# Patient Record
Sex: Male | Born: 2000 | Race: White | Hispanic: No | Marital: Single
Health system: Southern US, Community
[De-identification: ages and names within clinical notes are randomized; demographics above are authoritative.]

---

## 2020-07-17 ENCOUNTER — Emergency Department: Payer: 59

## 2020-07-17 DIAGNOSIS — R0789 Other chest pain: Secondary | ICD-10-CM | POA: Insufficient documentation

## 2020-07-17 MED ORDER — OXYCODONE-ACETAMINOPHEN 5-325 MG PO TABS
1.0000 | ORAL_TABLET | ORAL | Status: DC | PRN
  Administered 2020-07-17: 1 via ORAL
  Filled 2020-07-17: qty 1

## 2020-07-17 NOTE — ED Triage Notes (Signed)
Pt to ED reporting left sided rib pain x 3 days that has worsened over the past couple hours. Pt was dx by trainer at [facility] with a subluxed rib. Pain with taking a deep breath but no obvious respiratory distress in triage. Pt appears very uncomfortable at this time.

## 2020-07-18 ENCOUNTER — Emergency Department
Admission: EM | Admit: 2020-07-18 | Discharge: 2020-07-18 | Disposition: A | Discharge status: Home / Self Care | Payer: 59 | Attending: Emergency Medicine | Admitting: Emergency Medicine

## 2020-07-18 DIAGNOSIS — R0789 Other chest pain: Secondary | ICD-10-CM

## 2020-07-18 MED ORDER — KETOROLAC TROMETHAMINE 60 MG/2ML IM SOLN
30.0000 mg | Freq: Once | INTRAMUSCULAR | Status: AC
  Administered 2020-07-18: 30 mg via INTRAMUSCULAR
  Filled 2020-07-18: qty 2

## 2020-07-18 MED ORDER — KETOROLAC TROMETHAMINE 10 MG PO TABS: 10.0000 mg | ORAL_TABLET | Freq: Three times a day (TID) | ORAL | 0 refills | Status: AC | PRN

## 2020-07-18 MED ORDER — LIDOCAINE 5 % EX PTCH
1.0000 | MEDICATED_PATCH | CUTANEOUS | Status: DC
  Administered 2020-07-18: 1 via TRANSDERMAL
  Filled 2020-07-18: qty 1

## 2020-07-18 MED ORDER — LIDOCAINE 5 % EX PTCH: 1.0000 | MEDICATED_PATCH | Freq: Two times a day (BID) | CUTANEOUS | 0 refills | Status: AC

## 2020-07-18 NOTE — ED Notes (Signed)
Paper discharge consent signed by patient and placed in medical records bin.

## 2020-07-18 NOTE — Discharge Instructions (Signed)
Please seek medical attention for any high fevers, chest pain, shortness of breath, change in behavior, persistent vomiting, bloody stool or any other new or concerning symptoms.  

## 2020-07-18 NOTE — ED Provider Notes (Signed)
[facility] Emergency Department Provider Note   ____________________________________________   I have reviewed the triage vital signs and the nursing notes.   HISTORY  Chief Complaint Left sided chest pain  History limited by: Not Limited   HPI Todd Nichols is a 19 y.o. male who presents to the emergency department today because of concern for left sided chest pain. Pain started a couple of days ago. 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 The patient states he did see an Event organiser about the pain and was told he might have a subluxed rib. The patient has been taking over the counter medication to try to help with the pain. The pain has been associated with painful breathing. The patient denies any significant cough. Denies any fever. Denies similar pain in the past. No recent travel.    Records reviewed. Per medical record review patient has a history of right knee instability.   There are no problems to display for this patient.   Prior to Admission medications   Not on File    Allergies Patient has no allergy information on record.  No family history on file.  Social History Social History   Tobacco Use  . Smoking status: Not on file  Substance Use Topics  . Alcohol use: Not on file  . Drug use: Not on file    Review of Systems Constitutional: No fever/chills Eyes: No visual changes. ENT: No sore throat. Cardiovascular: Positive for left sided chest pain. Respiratory: Positive for painful breathing.  Gastrointestinal: No abdominal pain.  No nausea, no vomiting.  No diarrhea.   Genitourinary: Negative for dysuria. Musculoskeletal: Negative for back pain. Skin: Negative for rash. Neurological: Negative for headaches, focal weakness or numbness.  ____________________________________________   PHYSICAL EXAM:  VITAL SIGNS: ED Triage Vitals  Enc  Vitals Group     BP 07/17/20 2250 (!) 151/80     Pulse Rate 07/17/20 2250 64     Resp 07/17/20 2250 16     Temp 07/17/20 2250 98.7 F (37.1 C)     Temp Source 07/17/20 2250 Oral     SpO2 07/17/20 2250 100 %     Weight --      Height --      Head Circumference --      Peak Flow --      Pain Score 07/18/20 0150 0    Constitutional: Alert and oriented.  Eyes: Conjunctivae are normal.  ENT      Head: Normocephalic and atraumatic.      Nose: No congestion/rhinnorhea.      Mouth/Throat: Mucous membranes are moist.      Neck: No stridor. Hematological/Lymphatic/Immunilogical: No cervical lymphadenopathy. Cardiovascular: Normal rate, regular rhythm.  No murmurs, rubs, or gallops. Respiratory: Normal respiratory effort without tachypnea nor retractions. Breath sounds are clear and equal bilaterally. No wheezes/rales/rhonchi. Gastrointestinal: Soft and non tender. No rebound. No guarding.  Genitourinary: Deferred Musculoskeletal: Normal range of motion in all extremities. No lower extremity edema. Slight tenderness to palpation of the left chest wall.  Neurologic:  Normal speech and language. No gross focal neurologic deficits are appreciated.  Skin:  Skin is warm, dry and intact. No rash noted. Psychiatric: Mood and affect are normal. Speech and behavior are normal. Patient exhibits appropriate insight and judgment.  ____________________________________________    LABS (pertinent positives/negatives)  None  ____________________________________________   EKG  None  ____________________________________________  RADIOLOGY  CXR No acute cardiopulmonary disease. No osseous abnormality ____________________________________________   PROCEDURES  Procedures  ____________________________________________   INITIAL IMPRESSION / ASSESSMENT AND PLAN / ED COURSE  Pertinent labs & imaging results that were available during my care of the patient were reviewed by me and  considered in my medical decision making (see chart for details).   Patient presented to the emergency department tonight because of concern for left sided chest wall pain. On exam he does have some tenderness to palpation of that area. CXR was performed which did not show any concerning findings. At this time have extremely low concern for ACS/dissection/PE. Do think likely musculoskeletal in nature. Discussed this with the patient. Will plan on giving patient anti-inflammatory and lidoderm.   ____________________________________________   FINAL CLINICAL IMPRESSION(S) / ED DIAGNOSES  Final diagnoses:  Chest wall pain     Note: This dictation was prepared with Dragon dictation. Any transcriptional errors that result from this process are unintentional     Phineas Semen, MD 07/18/20 [phone]

## 2022-08-21 IMAGING — CR DG CHEST 2V
2 series · 2 of 2 positions shown · non-contrast
Comparison: None

CLINICAL DATA: Rib pain

EXAM:
CHEST - 2 VIEW

[chest pa]
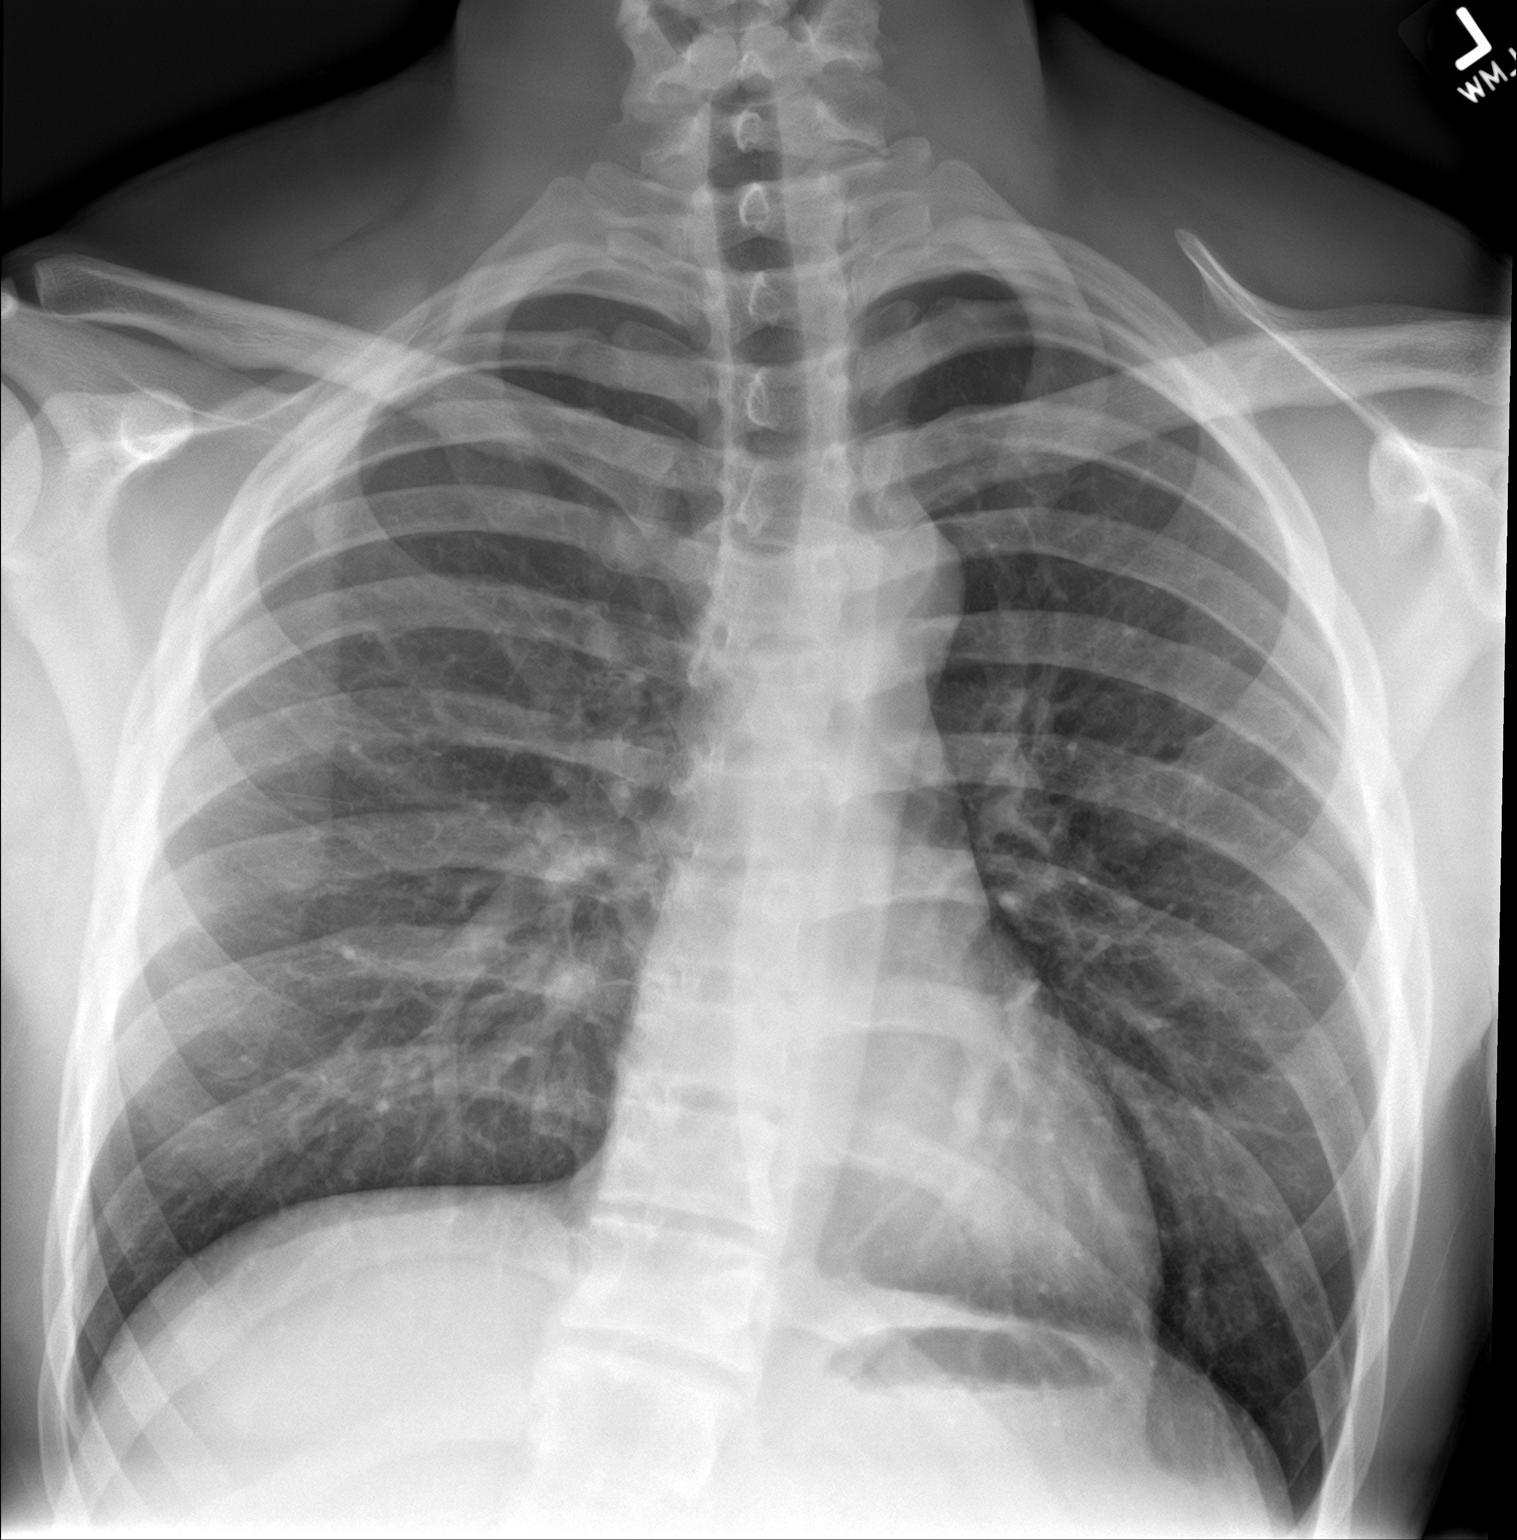

[chest lat]
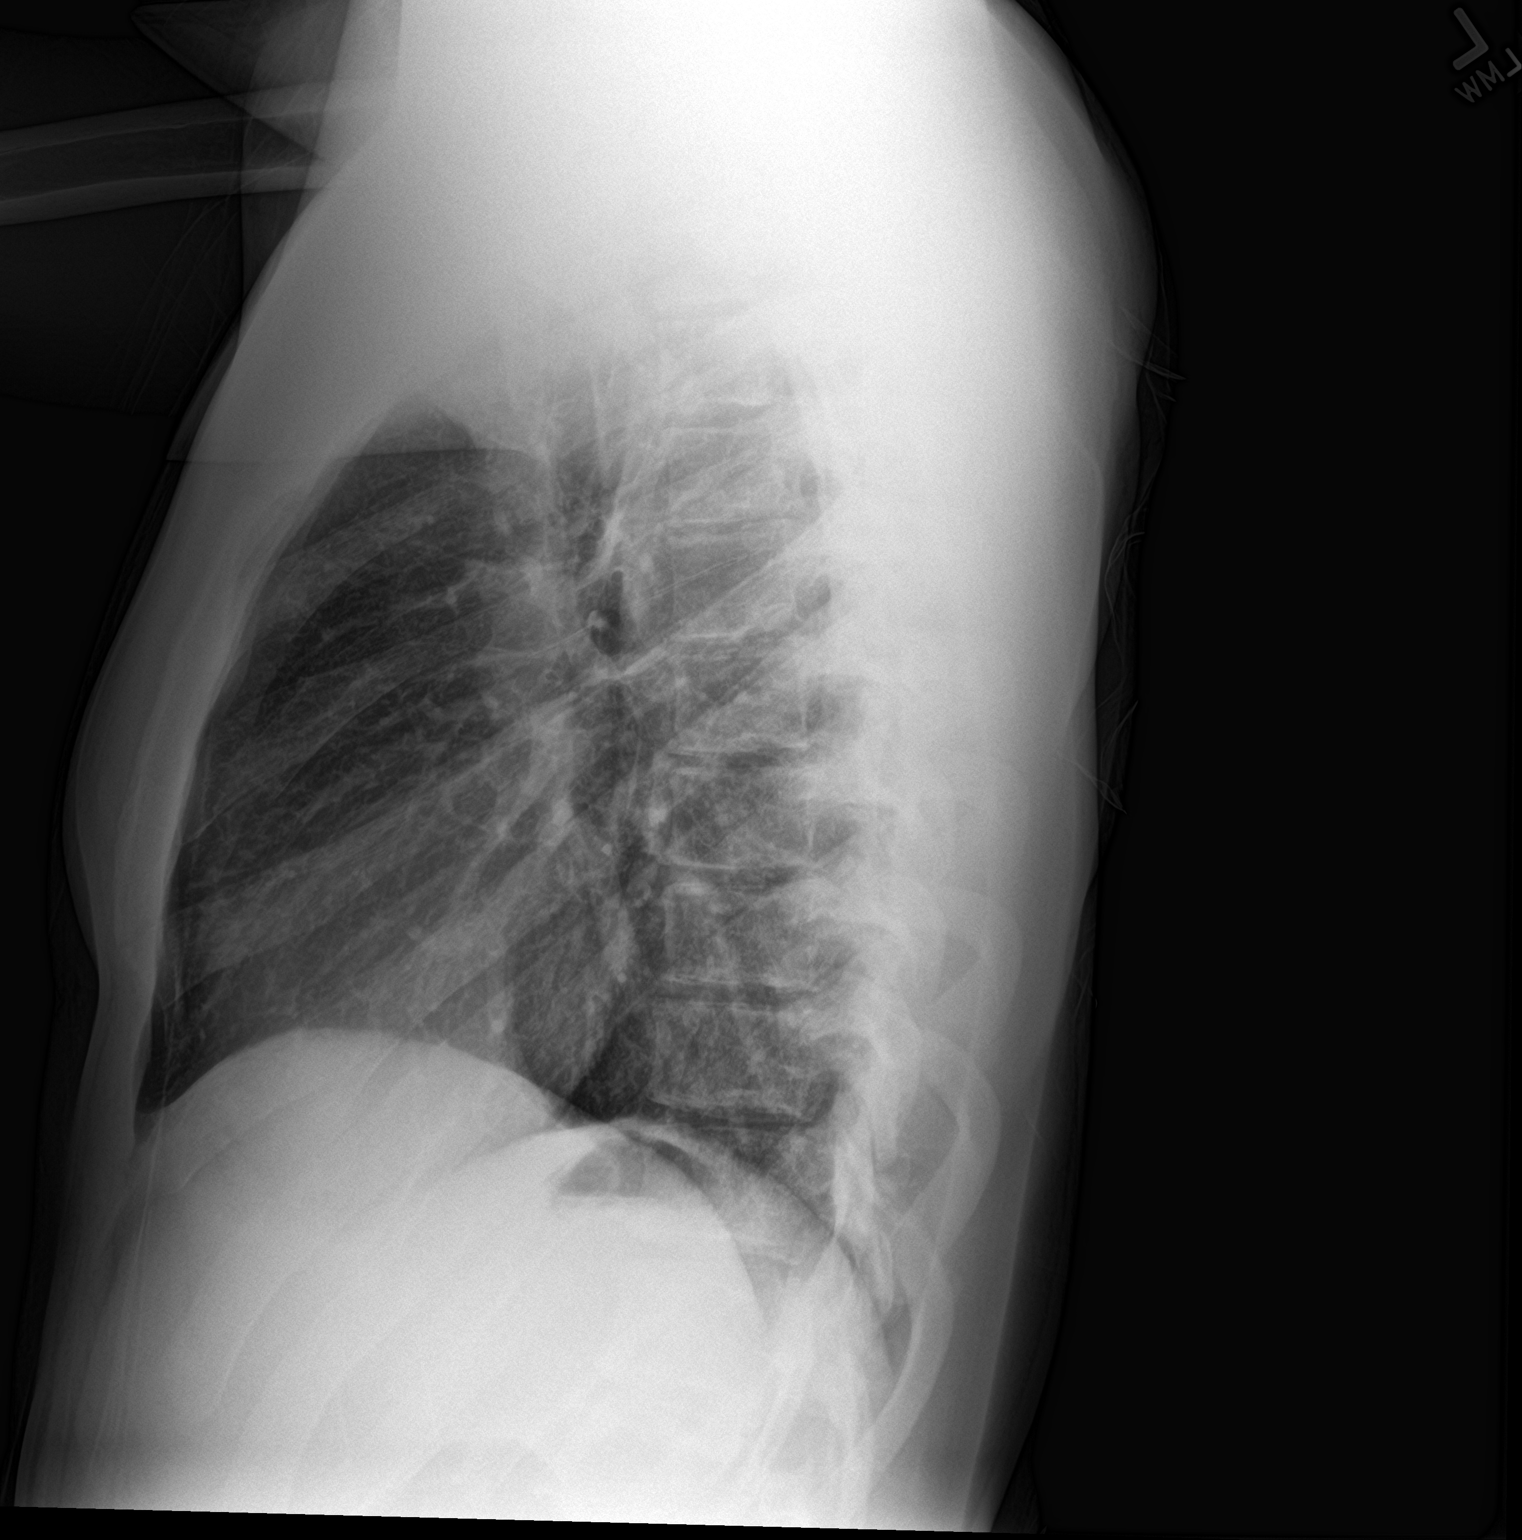

[2 of 2 positions shown; findings below may reference images not displayed]

FINDINGS: Levocurvature of the thoracic spine. Mild straightening of the
normal thoracic kyphosis. No visible displaced rib fracture other
acute traumatic osseous abnormality of the chest wall is visible. No
consolidation, features of edema, pneumothorax, or effusion. The
cardiomediastinal contours are unremarkable.
IMPRESSION: 1. No acute cardiopulmonary disease.
2. No visible displaced rib fracture or other acute traumatic
osseous abnormality of the chest wall.
